# Patient Record
Sex: Male | Born: 1975 | Race: White | Hispanic: No | Marital: Single | Smoking: Current every day smoker
Health system: Southern US, Community
[De-identification: ages and names within clinical notes are randomized; demographics above are authoritative.]

## PROBLEM LIST (undated history)

## (undated) DIAGNOSIS — F4310 Post-traumatic stress disorder, unspecified: Secondary | ICD-10-CM

## (undated) DIAGNOSIS — G9050 Complex regional pain syndrome I, unspecified: Secondary | ICD-10-CM

## (undated) DIAGNOSIS — E079 Disorder of thyroid, unspecified: Secondary | ICD-10-CM

## (undated) DIAGNOSIS — F32A Depression, unspecified: Secondary | ICD-10-CM

## (undated) DIAGNOSIS — F419 Anxiety disorder, unspecified: Secondary | ICD-10-CM

## (undated) DIAGNOSIS — F329 Major depressive disorder, single episode, unspecified: Secondary | ICD-10-CM

## (undated) HISTORY — PX: SPINAL CORD STIMULATOR REMOVAL: SHX5379

## (undated) HISTORY — PX: APPENDECTOMY: SHX54

## (undated) HISTORY — PX: HAND SURGERY: SHX662

## (undated) HISTORY — PX: CHOLECYSTECTOMY: SHX55

## (undated) HISTORY — PX: SPINAL CORD STIMULATOR INSERTION: SHX5378

---

## 2005-06-25 ENCOUNTER — Emergency Department: Payer: Self-pay | Admitting: Emergency Medicine

## 2005-07-02 ENCOUNTER — Emergency Department: Payer: Self-pay | Admitting: Emergency Medicine

## 2005-07-03 ENCOUNTER — Emergency Department: Payer: Self-pay | Admitting: Emergency Medicine

## 2005-08-17 ENCOUNTER — Emergency Department: Payer: Self-pay | Admitting: Emergency Medicine

## 2005-09-13 ENCOUNTER — Other Ambulatory Visit: Payer: Self-pay

## 2005-09-13 ENCOUNTER — Inpatient Hospital Stay: Payer: Self-pay | Admitting: Internal Medicine

## 2005-09-14 ENCOUNTER — Inpatient Hospital Stay: Payer: Self-pay | Admitting: Unknown Physician Specialty

## 2006-06-22 ENCOUNTER — Emergency Department: Payer: Self-pay | Admitting: Emergency Medicine

## 2006-07-20 ENCOUNTER — Ambulatory Visit: Payer: Self-pay | Admitting: Pain Medicine

## 2006-08-03 ENCOUNTER — Encounter: Payer: Self-pay | Admitting: Specialist

## 2006-08-16 ENCOUNTER — Ambulatory Visit: Payer: Self-pay | Admitting: Pain Medicine

## 2006-08-29 ENCOUNTER — Ambulatory Visit: Payer: Self-pay | Admitting: Physician Assistant

## 2006-09-01 ENCOUNTER — Encounter: Payer: Self-pay | Admitting: Specialist

## 2006-09-19 ENCOUNTER — Ambulatory Visit: Payer: Self-pay | Admitting: Physician Assistant

## 2006-11-10 ENCOUNTER — Ambulatory Visit: Payer: Self-pay | Admitting: Physician Assistant

## 2006-11-15 ENCOUNTER — Ambulatory Visit: Payer: Self-pay | Admitting: Pain Medicine

## 2006-11-21 ENCOUNTER — Ambulatory Visit: Payer: Self-pay | Admitting: Pain Medicine

## 2006-12-06 ENCOUNTER — Ambulatory Visit: Payer: Self-pay | Admitting: Pain Medicine

## 2006-12-15 ENCOUNTER — Ambulatory Visit: Payer: Self-pay | Admitting: Physician Assistant

## 2007-01-12 ENCOUNTER — Ambulatory Visit: Payer: Self-pay | Admitting: Physician Assistant

## 2007-11-25 ENCOUNTER — Emergency Department: Payer: Self-pay | Admitting: Emergency Medicine

## 2007-12-17 ENCOUNTER — Inpatient Hospital Stay: Payer: Self-pay | Admitting: Unknown Physician Specialty

## 2007-12-20 ENCOUNTER — Ambulatory Visit: Payer: Self-pay | Admitting: Pain Medicine

## 2008-01-25 ENCOUNTER — Inpatient Hospital Stay: Payer: Self-pay | Admitting: Internal Medicine

## 2008-01-25 ENCOUNTER — Other Ambulatory Visit: Payer: Self-pay

## 2008-01-26 ENCOUNTER — Inpatient Hospital Stay: Payer: Self-pay | Admitting: Psychiatry

## 2008-03-17 ENCOUNTER — Emergency Department: Payer: Self-pay | Admitting: Emergency Medicine

## 2008-03-17 ENCOUNTER — Other Ambulatory Visit: Payer: Self-pay

## 2008-10-15 ENCOUNTER — Ambulatory Visit: Payer: Self-pay | Admitting: Pain Medicine

## 2008-11-06 ENCOUNTER — Ambulatory Visit: Payer: Self-pay | Admitting: Physician Assistant

## 2008-11-06 ENCOUNTER — Ambulatory Visit: Payer: Self-pay | Admitting: Pain Medicine

## 2008-11-12 ENCOUNTER — Ambulatory Visit: Payer: Self-pay | Admitting: Pain Medicine

## 2008-11-26 ENCOUNTER — Ambulatory Visit: Payer: Self-pay | Admitting: Physician Assistant

## 2008-12-05 ENCOUNTER — Ambulatory Visit (HOSPITAL_COMMUNITY): Admission: RE | Admit: 2008-12-05 | Discharge: 2008-12-05 | Payer: Self-pay | Admitting: Anesthesiology

## 2011-07-03 DEATH — deceased

## 2011-09-06 ENCOUNTER — Ambulatory Visit: Payer: Self-pay | Admitting: Pain Medicine

## 2011-09-09 ENCOUNTER — Ambulatory Visit: Payer: Self-pay | Admitting: Pain Medicine

## 2011-09-20 ENCOUNTER — Ambulatory Visit: Payer: Self-pay | Admitting: Pain Medicine

## 2011-09-29 ENCOUNTER — Ambulatory Visit: Payer: Self-pay | Admitting: Pain Medicine

## 2012-04-04 ENCOUNTER — Emergency Department (HOSPITAL_COMMUNITY)
Admission: EM | Admit: 2012-04-04 | Discharge: 2012-04-04 | Disposition: A | Discharge status: Home / Self Care | Payer: Medicare Other | Attending: Emergency Medicine | Admitting: Emergency Medicine

## 2012-04-04 ENCOUNTER — Emergency Department (HOSPITAL_COMMUNITY): Payer: Medicare Other

## 2012-04-04 ENCOUNTER — Encounter (HOSPITAL_COMMUNITY): Payer: Self-pay

## 2012-04-04 DIAGNOSIS — Z79899 Other long term (current) drug therapy: Secondary | ICD-10-CM | POA: Insufficient documentation

## 2012-04-04 DIAGNOSIS — E079 Disorder of thyroid, unspecified: Secondary | ICD-10-CM | POA: Insufficient documentation

## 2012-04-04 DIAGNOSIS — F341 Dysthymic disorder: Secondary | ICD-10-CM | POA: Insufficient documentation

## 2012-04-04 DIAGNOSIS — Y92009 Unspecified place in unspecified non-institutional (private) residence as the place of occurrence of the external cause: Secondary | ICD-10-CM | POA: Insufficient documentation

## 2012-04-04 DIAGNOSIS — W208XXA Other cause of strike by thrown, projected or falling object, initial encounter: Secondary | ICD-10-CM | POA: Insufficient documentation

## 2012-04-04 DIAGNOSIS — M25579 Pain in unspecified ankle and joints of unspecified foot: Secondary | ICD-10-CM | POA: Insufficient documentation

## 2012-04-04 DIAGNOSIS — M79673 Pain in unspecified foot: Secondary | ICD-10-CM

## 2012-04-04 HISTORY — DX: Depression, unspecified: F32.A

## 2012-04-04 HISTORY — DX: Disorder of thyroid, unspecified: E07.9

## 2012-04-04 HISTORY — DX: Major depressive disorder, single episode, unspecified: F32.9

## 2012-04-04 HISTORY — DX: Complex regional pain syndrome I, unspecified: G90.50

## 2012-04-04 HISTORY — DX: Anxiety disorder, unspecified: F41.9

## 2012-04-04 HISTORY — DX: Post-traumatic stress disorder, unspecified: F43.10

## 2012-04-04 NOTE — ED Provider Notes (Signed)
History     CSN: [id number]  Arrival date & time 04/04/12  1424   First MD Initiated Contact with Patient 04/04/12 1505      Chief Complaint  Patient presents with  . Foot Pain    (Consider location/radiation/quality/duration/timing/severity/associated sxs/prior treatment) HPI Comments: Patient reports that last evening he dropped a glass casserole dish containing two frozen pieces of chicken on the top of the left foot.  He has been having pain since that time.  He reports that while walking this morning he think that he felt a "pop"  The pain and swelling then became worse.  He denies any fever or chills.  He is seen by Pain Management and is on a fentanyl patch and takes oxycodone regularly for RSD.  He feels that the Oxycodone has helped the pain somewhat.  Pain worse with ambulation.    Patient is a 36 y.o. male presenting with lower extremity pain. The history is provided by the patient.  Foot Pain Pertinent negatives include no chills, fever, nausea, numbness or vomiting.    Past Medical History  Diagnosis Date  . RSD (reflex sympathetic dystrophy)     LT forearm/hand  . Thyroid disease     hyperthyroid w/radiation treatment  . PTSD (post-traumatic stress disorder)   . Depression   . Anxiety     Past Surgical History  Procedure Date  . Appendectomy   . Cholecystectomy   . Hand surgery     x 2  . Spinal cord stimulator insertion   . Spinal cord stimulator removal     History reviewed. No pertinent family history.  History  Substance Use Topics  . Smoking status: Current Everyday Smoker -- 0.5 packs/day    Types: Cigarettes  . Smokeless tobacco: Not on file  . Alcohol Use: No      Review of Systems  Constitutional: Negative for fever and chills.  Gastrointestinal: Negative for nausea and vomiting.  Musculoskeletal:       Swelling of the dorsal left foot  Skin: Negative for wound.  Neurological: Negative for numbness.    Allergies  Penicillins cross  reactors  Home Medications   Current Outpatient Rx  Name Route Sig Dispense Refill  . CLONAZEPAM 1 MG PO TABS Oral Take 1 mg by mouth 3 (three) times daily.    Marland Kitchen CLONIDINE HCL 0.3 MG PO TABS Oral Take 0.3 mg by mouth See admin instructions. In the am take .15mg  At at night .45mg     . FENTANYL 75 MCG/HR TD PT72 Transdermal Place 1 patch onto the skin 2 days. Applied new patch 04/03/12    . LEVOTHYROXINE SODIUM 100 MCG PO TABS Oral Take 100 mcg by mouth daily.    . OXYCODONE HCL 5 MG PO TABS Oral Take 20 mg by mouth 4 (four) times daily.    Marland Kitchen ZOLPIDEM TARTRATE ER 12.5 MG PO TBCR Oral Take 12.5 mg by mouth at bedtime.      BP 102/74  Pulse 85  Temp(Src) 98.4 F (36.9 C) (Oral)  Resp 16  SpO2 100%  Physical Exam  Nursing note and vitals reviewed. Constitutional: He appears well-developed and well-nourished. No distress.  HENT:  Head: Normocephalic and atraumatic.  Mouth/Throat: Oropharynx is clear and moist.  Cardiovascular: Normal rate, regular rhythm and normal heart sounds.   Pulses:      Dorsalis pedis pulses are 2+ on the right side, and 2+ on the left side.  Pulmonary/Chest: Effort normal and breath sounds normal.  Musculoskeletal:  Left ankle: He exhibits normal range of motion, no swelling, no ecchymosis and no deformity. no tenderness. Achilles tendon normal.       Swelling over the dorsal left foot in the area of the 1st-5th metatarsals.  Neurological: He is alert. No sensory deficit.  Skin: Skin is warm and dry. He is not diaphoretic.  Psychiatric: He has a normal mood and affect.    ED Course  Procedures (including critical care time)  Labs Reviewed - No data to display Dg Foot Complete Left  04/04/2012  *RADIOLOGY REPORT*  Clinical Data: Trauma to the top of the foot, pain and swelling  LEFT FOOT - COMPLETE 3+ VIEW  Comparison: None.  Findings: Tarsal - metatarsal alignment is normal.  No fracture is seen.  Joint spaces appear normal.  IMPRESSION: Negative.   Original Report Authenticated By: Juline Patch, M.D.     1. Foot pain       MDM  Patient presents with pain of the dorsal left foot after dropping a glass casserole dish on it last evening.  Negative xray.  Neurovascularly intact.  Patient already has Oxycodone at home.        Pascal Lux [place], PA-C 04/05/12 1456

## 2012-04-04 NOTE — ED Notes (Signed)
Pt states 2 frozen chickens and large glass dish dropped onto his LT foot last night.  Today he took a step outside and heard a pop.  He went to see his pain management MD who sent him here for xrays.  Pt is ambulatory, walks mainly on his heel.

## 2012-04-06 NOTE — ED Provider Notes (Signed)
Medical screening examination/treatment/procedure(s) were performed by non-physician practitioner and as supervising physician I was immediately available for consultation/collaboration.    Kadie Balestrieri R Giorgia Wahler, MD 04/06/12 1103 

## 2012-08-09 ENCOUNTER — Ambulatory Visit: Payer: Self-pay | Admitting: Pain Medicine

## 2012-08-24 ENCOUNTER — Ambulatory Visit: Payer: Self-pay | Admitting: Pain Medicine

## 2012-09-04 ENCOUNTER — Ambulatory Visit: Payer: Self-pay | Admitting: Pain Medicine

## 2012-12-17 ENCOUNTER — Emergency Department: Payer: Self-pay | Admitting: Emergency Medicine

## 2012-12-17 LAB — URINALYSIS, COMPLETE
Bilirubin,UR: NEGATIVE
Ketone: NEGATIVE
Leukocyte Esterase: NEGATIVE
Nitrite: NEGATIVE
Ph: 6 (ref 4.5–8.0)
RBC,UR: 2 /HPF (ref 0–5)
Squamous Epithelial: NONE SEEN

## 2012-12-17 LAB — COMPREHENSIVE METABOLIC PANEL
Albumin: 4.4 g/dL (ref 3.4–5.0)
Anion Gap: 6 — ABNORMAL LOW (ref 7–16)
Bilirubin,Total: 0.9 mg/dL (ref 0.2–1.0)
Chloride: 104 mmol/L (ref 98–107)
Co2: 29 mmol/L (ref 21–32)
Creatinine: 1.33 mg/dL — ABNORMAL HIGH (ref 0.60–1.30)
SGOT(AST): 27 U/L (ref 15–37)
SGPT (ALT): 39 U/L (ref 12–78)
Total Protein: 7.8 g/dL (ref 6.4–8.2)

## 2012-12-17 LAB — CBC
HCT: 49.8 % (ref 40.0–52.0)
MCHC: 33.1 g/dL (ref 32.0–36.0)
MCV: 85 fL (ref 80–100)
RBC: 5.9 10*6/uL (ref 4.40–5.90)
RDW: 14.2 % (ref 11.5–14.5)

## 2013-03-23 ENCOUNTER — Ambulatory Visit: Payer: Self-pay | Admitting: Family Medicine

## 2013-05-31 IMAGING — CT CT ABD-PELV W/ CM
1 of 2 series · 15 of 32 positions shown, 19 images · IV contrast (isovue)
Comparison: none

REASON FOR EXAM: Lower abd pain
COMMENTS:

PROCEDURE:     KCT - KCT ABDOMEN/PELVIS W  - March 23, 2013  [DATE]
RESULT:     Comparison:  12/18/2012
TECHNIQUE: Multiple axial images of the abdomen and pelvis were performed
from the lung bases to the pubic symphysis, with p.o. contrast and with 85
mL of Isovue 300 intravenous contrast.

[Series 2: abd 3mm w 3.0 i40f 3 · axial · 0.72mm/px · z∈[-1030,-610]mm · 15 of 154 slices shown, 19 images]
[im 7/154  soft-tissue]
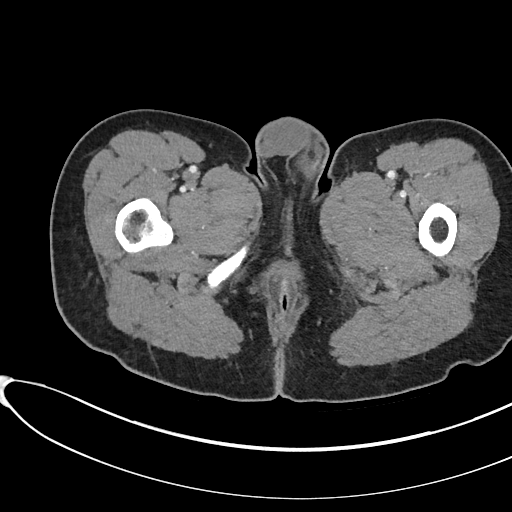
[im 7/154  bone]
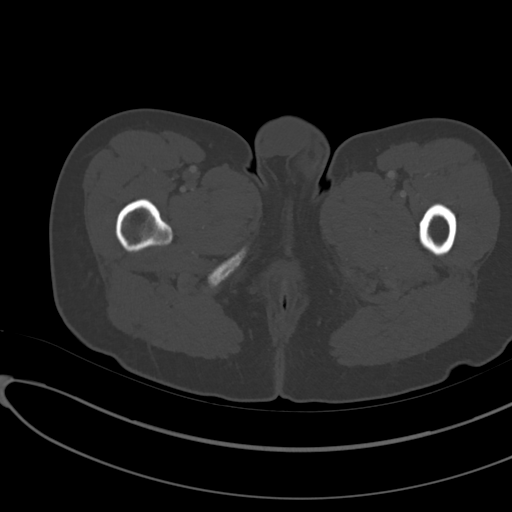
[im 19/154  soft-tissue]
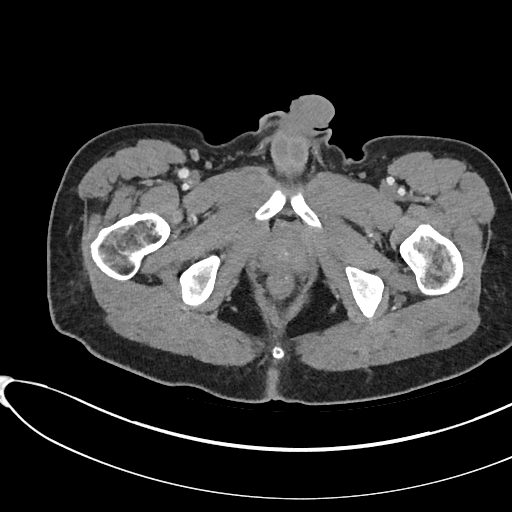
[im 31/154  soft-tissue]
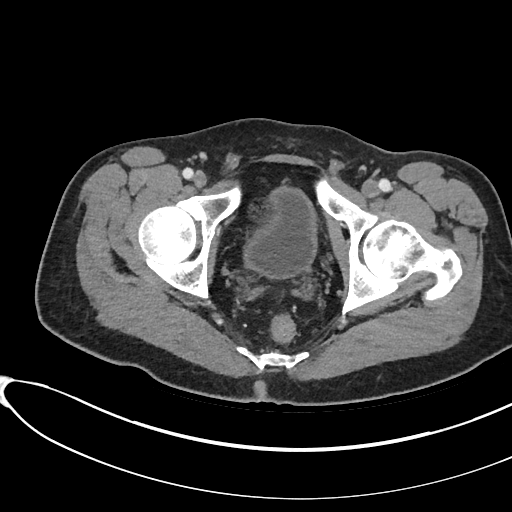
[im 43/154  soft-tissue]
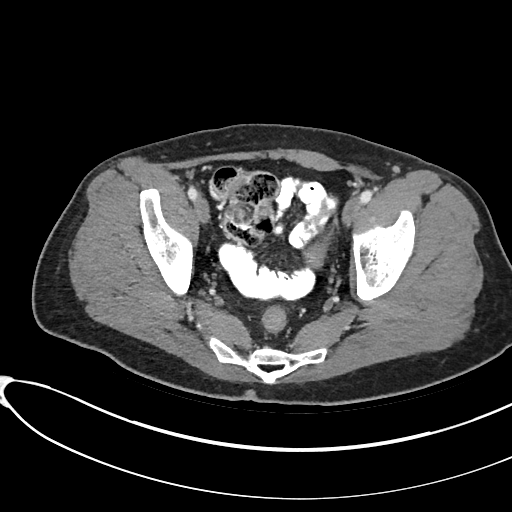
[im 56/154  soft-tissue]
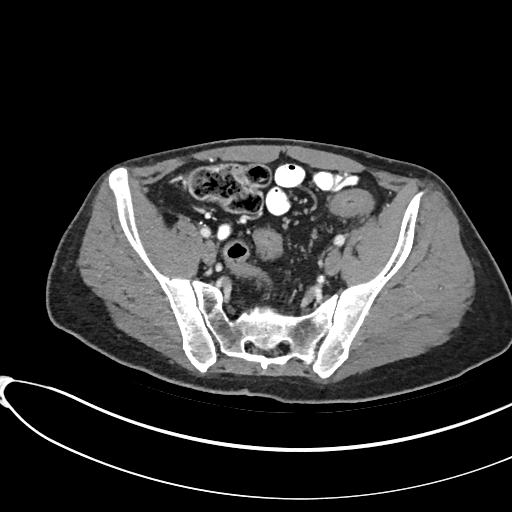
[im 68/154  soft-tissue]
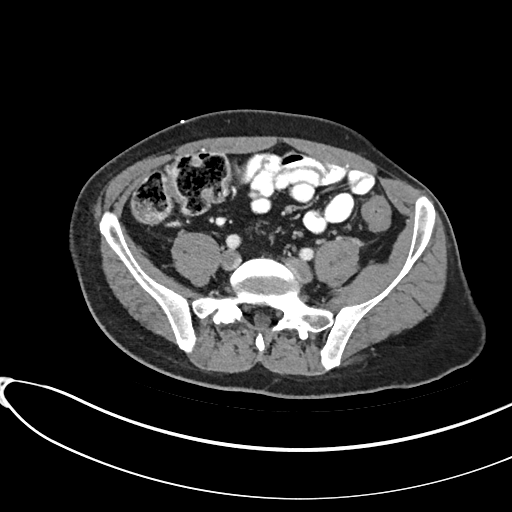
[im 80/154  soft-tissue]
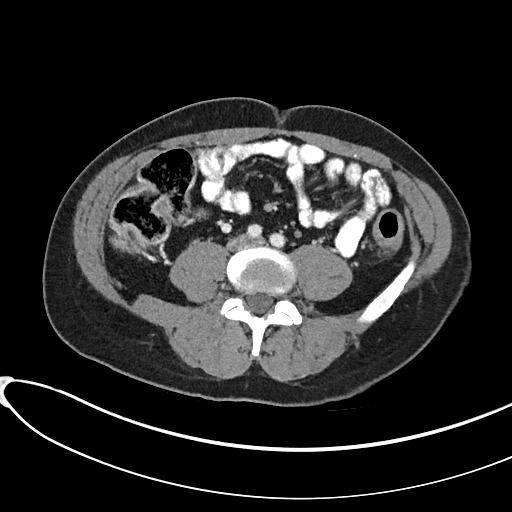
[im 86/154  soft-tissue]
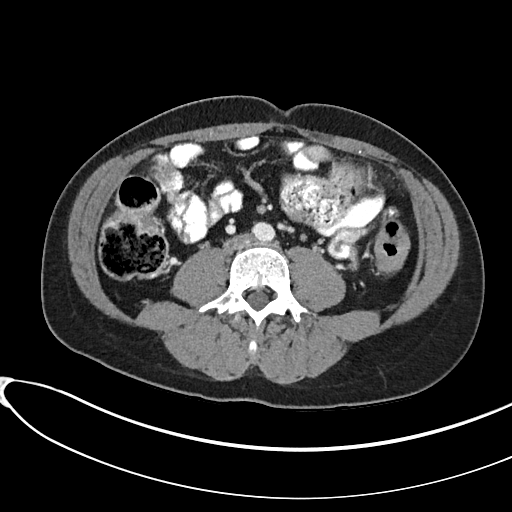
[im 98/154  soft-tissue]
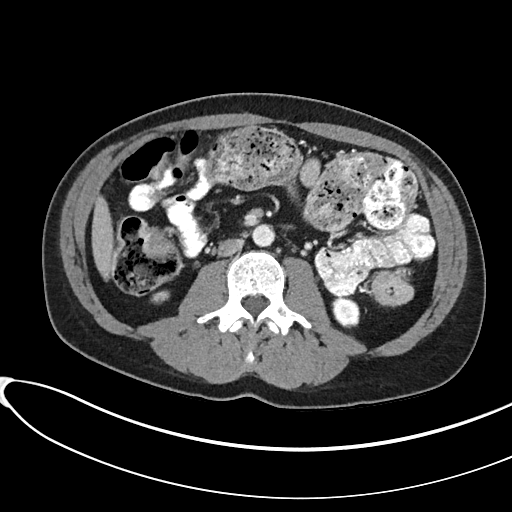
[im 98/154  bone]
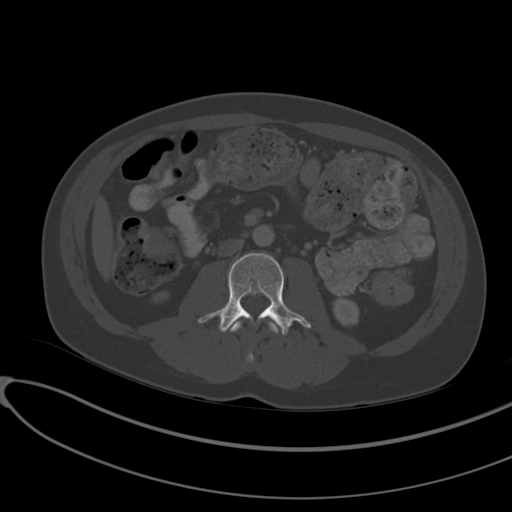
[im 111/154  soft-tissue]
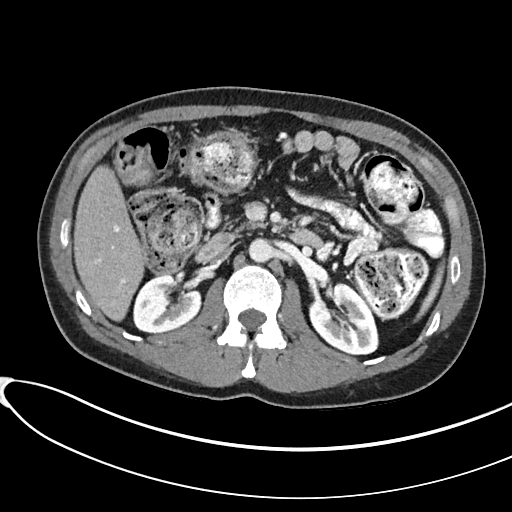
[im 123/154  soft-tissue]
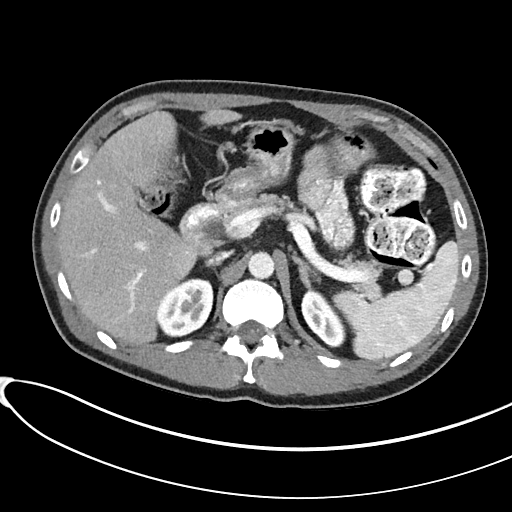
[im 129/154  lung]
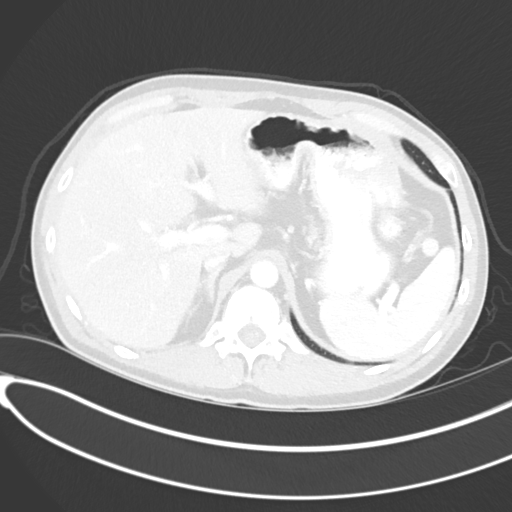
[im 135/154  soft-tissue]
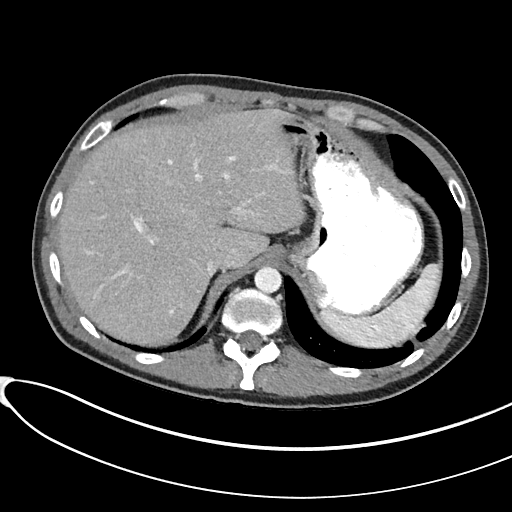
[im 135/154  lung]
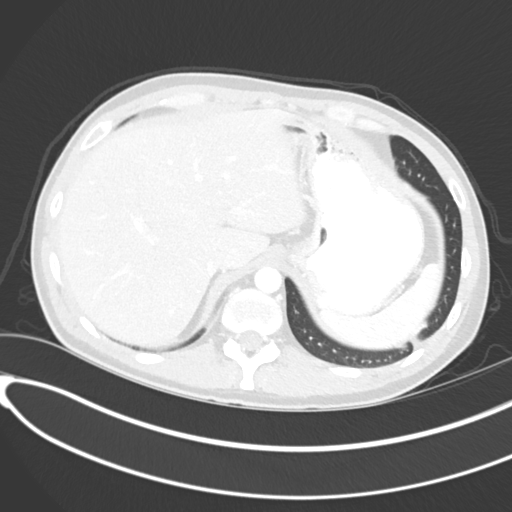
[im 141/154  lung]
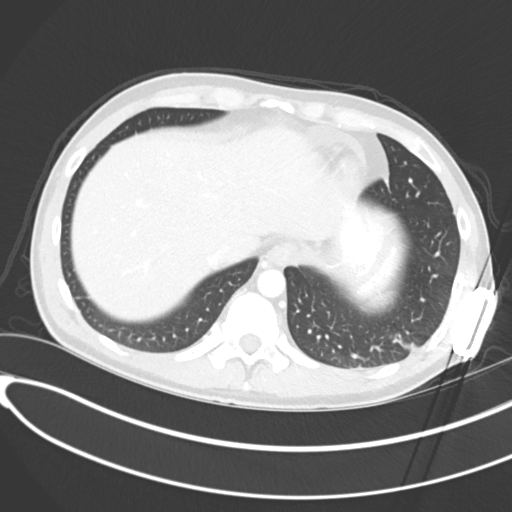
[im 147/154  soft-tissue]
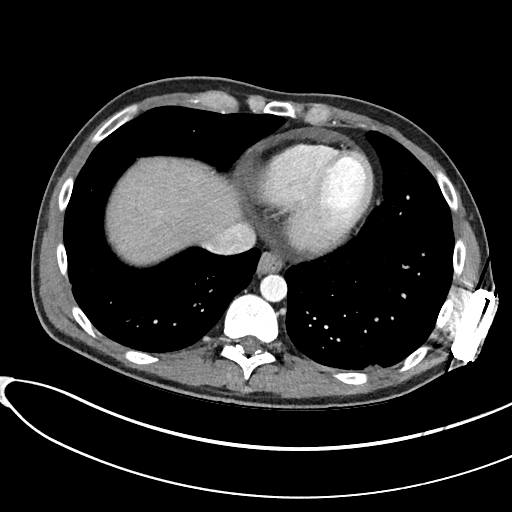
[im 147/154  lung]
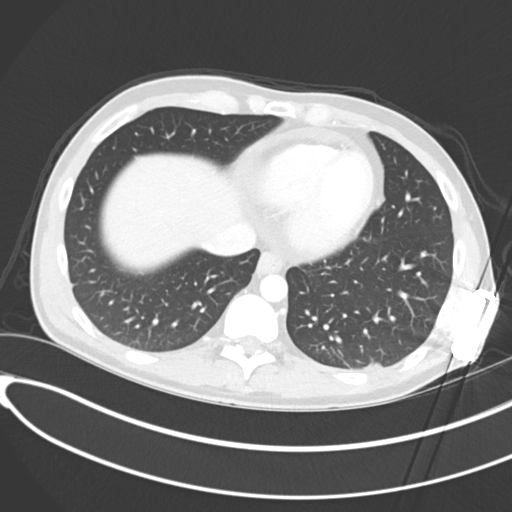

[15 of 32 positions shown; findings below may reference images not displayed]

FINDINGS: Mild subpleural opacities are likely secondary to atelectasis. A metallic
generator device overlies the left lateral lower thorax. There is a trace
amount of pericardial fluid.

Low-attenuation along the falciform ligament likely represents focal fatty
deposition. Surgical clips are seen from prior cholecystectomy. The spleen,
adrenals, and pancreas are unremarkable. The kidneys enhance normally.

The small bowel is normal in caliber. The appendix is not visualized.
However, there are no inflammatory changes at the base of the cecum. The
ascending colon and transverse colon are mildly distended with stool. This
is seen to the level of the splenic flexure were the colon distal to this
level is decompressed. There is apparent bowel wall thickening of the
descending colon which is more conspicuous and the prior CT. This bowel
thickening is seen in the descending colon as well as to a lesser extent the
sigmoid colon. There does not appear to be significant inflammatory change
in the adjacent fat.

No aggressive lytic or sclerotic osseous lesions are identified.
IMPRESSION: The ascending and transverse colon are mildly distended with stool, with
relative decompression of the descending and sigmoid colon. There appears to
be bowel wall thickening of the descending and sigmoid colon which is more
conspicuous than prior. An infiltrative or inflammatory process involving
the descending and sigmoid colon is raised. Colonoscopy is suggested for
further evaluation. Given the distention with stool in the transverse colon,
correlate for some degree of bowel obstruction.

## 2014-03-14 LAB — BASIC METABOLIC PANEL
Anion Gap: 3 — ABNORMAL LOW (ref 7–16)
BUN: 21 mg/dL — ABNORMAL HIGH (ref 7–18)
CALCIUM: 8.9 mg/dL (ref 8.5–10.1)
CHLORIDE: 105 mmol/L (ref 98–107)
CREATININE: 1.15 mg/dL (ref 0.60–1.30)
Co2: 33 mmol/L — ABNORMAL HIGH (ref 21–32)
EGFR (African American): >60
GLUCOSE: 90 mg/dL (ref 65–99)
Osmolality: 284 (ref 275–301)
Potassium: 3.9 mmol/L (ref 3.5–5.1)
SODIUM: 141 mmol/L (ref 136–145)

## 2014-03-14 LAB — CBC
HCT: 47.4 % (ref 40.0–52.0)
HGB: 16.1 g/dL (ref 13.0–18.0)
MCH: 28.6 pg (ref 26.0–34.0)
MCHC: 34 g/dL (ref 32.0–36.0)
MCV: 84 fL (ref 80–100)
Platelet: 227 10*3/uL (ref 150–440)
RBC: 5.63 10*6/uL (ref 4.40–5.90)
RDW: 13.7 % (ref 11.5–14.5)
WBC: 6.8 10*3/uL (ref 3.8–10.6)

## 2014-03-14 LAB — PROTIME-INR
INR: 1.1
Prothrombin Time: 13.9 secs (ref 11.5–14.7)

## 2014-03-14 LAB — APTT: ACTIVATED PTT: 34.9 s (ref 23.6–35.9)

## 2014-03-14 LAB — SEDIMENTATION RATE: Erythrocyte Sed Rate: 1 mm/hr (ref 0–15)

## 2014-03-15 ENCOUNTER — Inpatient Hospital Stay: Payer: Self-pay | Admitting: Internal Medicine

## 2014-03-15 LAB — CBC WITH DIFFERENTIAL/PLATELET
BASOS ABS: 0 10*3/uL (ref 0.0–0.1)
BASOS PCT: 0.7 %
Eosinophil #: 0.2 10*3/uL (ref 0.0–0.7)
Eosinophil %: 3.5 %
HCT: 44.2 % (ref 40.0–52.0)
HGB: 15.2 g/dL (ref 13.0–18.0)
Lymphocyte #: 1.5 10*3/uL (ref 1.0–3.6)
Lymphocyte %: 26.8 %
MCH: 28.8 pg (ref 26.0–34.0)
MCHC: 34.4 g/dL (ref 32.0–36.0)
MCV: 84 fL (ref 80–100)
MONOS PCT: 7.5 %
Monocyte #: 0.4 x10 3/mm (ref 0.2–1.0)
NEUTROS ABS: 3.4 10*3/uL (ref 1.4–6.5)
Neutrophil %: 61.5 %
PLATELETS: 205 10*3/uL (ref 150–440)
RBC: 5.28 10*6/uL (ref 4.40–5.90)
RDW: 13.4 % (ref 11.5–14.5)
WBC: 5.5 10*3/uL (ref 3.8–10.6)

## 2014-03-16 LAB — CBC WITH DIFFERENTIAL/PLATELET
Basophil #: 0 10*3/uL (ref 0.0–0.1)
Basophil %: 1 %
EOS ABS: 0.2 10*3/uL (ref 0.0–0.7)
Eosinophil %: 3.4 %
HCT: 42.2 % (ref 40.0–52.0)
HGB: 14.4 g/dL (ref 13.0–18.0)
LYMPHS PCT: 33.6 %
Lymphocyte #: 1.7 10*3/uL (ref 1.0–3.6)
MCH: 28.6 pg (ref 26.0–34.0)
MCHC: 34.1 g/dL (ref 32.0–36.0)
MCV: 84 fL (ref 80–100)
MONO ABS: 0.3 x10 3/mm (ref 0.2–1.0)
MONOS PCT: 6.3 %
NEUTROS ABS: 2.8 10*3/uL (ref 1.4–6.5)
Neutrophil %: 55.7 %
PLATELETS: 201 10*3/uL (ref 150–440)
RBC: 5.04 10*6/uL (ref 4.40–5.90)
RDW: 13.6 % (ref 11.5–14.5)
WBC: 5 10*3/uL (ref 3.8–10.6)

## 2014-03-16 LAB — BASIC METABOLIC PANEL
Anion Gap: 3 — ABNORMAL LOW (ref 7–16)
Anion Gap: 4 — ABNORMAL LOW (ref 7–16)
BUN: 19 mg/dL — AB (ref 7–18)
BUN: 18 mg/dL (ref 7–18)
CALCIUM: 8.5 mg/dL (ref 8.5–10.1)
CO2: 30 mmol/L (ref 21–32)
CREATININE: 1.04 mg/dL (ref 0.60–1.30)
Calcium, Total: 8.4 mg/dL — ABNORMAL LOW (ref 8.5–10.1)
Chloride: 104 mmol/L (ref 98–107)
Chloride: 108 mmol/L — ABNORMAL HIGH (ref 98–107)
Co2: 32 mmol/L (ref 21–32)
Creatinine: 1.27 mg/dL (ref 0.60–1.30)
Glucose: 161 mg/dL — ABNORMAL HIGH (ref 65–99)
Glucose: 123 mg/dL — ABNORMAL HIGH (ref 65–99)
OSMOLALITY: 286 (ref 275–301)
Osmolality: 283 (ref 275–301)
POTASSIUM: 3.6 mmol/L (ref 3.5–5.1)
POTASSIUM: 4.1 mmol/L (ref 3.5–5.1)
SODIUM: 139 mmol/L (ref 136–145)
SODIUM: 142 mmol/L (ref 136–145)

## 2014-03-16 LAB — VANCOMYCIN, TROUGH: Vancomycin, Trough: 17 ug/mL (ref 10–20)

## 2014-03-17 LAB — BASIC METABOLIC PANEL WITH GFR
Anion Gap: 1 — ABNORMAL LOW
BUN: 18 mg/dL
Calcium, Total: 8.4 mg/dL — ABNORMAL LOW
Chloride: 109 mmol/L — ABNORMAL HIGH
Co2: 30 mmol/L
Creatinine: 1.33 mg/dL — ABNORMAL HIGH
EGFR (African American): >60
EGFR (Non-African Amer.): >60
Glucose: 150 mg/dL — ABNORMAL HIGH
Osmolality: 284
Potassium: 3.8 mmol/L
Sodium: 140 mmol/L

## 2014-03-26 ENCOUNTER — Emergency Department: Payer: Self-pay | Admitting: Emergency Medicine

## 2014-03-26 LAB — CBC WITH DIFFERENTIAL/PLATELET
BASOS PCT: 0.3 %
Basophil #: 0 10*3/uL (ref 0.0–0.1)
EOS PCT: 0.7 %
Eosinophil #: 0.1 10*3/uL (ref 0.0–0.7)
HCT: 48.8 % (ref 40.0–52.0)
HGB: 16.3 g/dL (ref 13.0–18.0)
LYMPHS PCT: 11 %
Lymphocyte #: 0.9 10*3/uL — ABNORMAL LOW (ref 1.0–3.6)
MCH: 28.3 pg (ref 26.0–34.0)
MCHC: 33.5 g/dL (ref 32.0–36.0)
MCV: 84 fL (ref 80–100)
MONO ABS: 0.6 x10 3/mm (ref 0.2–1.0)
Monocyte %: 6.8 %
NEUTROS ABS: 6.8 10*3/uL — AB (ref 1.4–6.5)
NEUTROS PCT: 81.2 %
PLATELETS: 281 10*3/uL (ref 150–440)
RBC: 5.78 10*6/uL (ref 4.40–5.90)
RDW: 14.3 % (ref 11.5–14.5)
WBC: 8.4 10*3/uL (ref 3.8–10.6)

## 2014-03-26 LAB — COMPREHENSIVE METABOLIC PANEL
ALBUMIN: 4 g/dL (ref 3.4–5.0)
ALK PHOS: 78 U/L
ALT: 50 U/L (ref 12–78)
AST: 40 U/L — AB (ref 15–37)
Anion Gap: 7 (ref 7–16)
BILIRUBIN TOTAL: 0.7 mg/dL (ref 0.2–1.0)
BUN: 19 mg/dL — AB (ref 7–18)
CALCIUM: 9.1 mg/dL (ref 8.5–10.1)
CHLORIDE: 105 mmol/L (ref 98–107)
Co2: 28 mmol/L (ref 21–32)
Creatinine: 0.95 mg/dL (ref 0.60–1.30)
EGFR (African American): >60
EGFR (Non-African Amer.): >60
Glucose: 111 mg/dL — ABNORMAL HIGH (ref 65–99)
OSMOLALITY: 282 (ref 275–301)
POTASSIUM: 4 mmol/L (ref 3.5–5.1)
SODIUM: 140 mmol/L (ref 136–145)
Total Protein: 7.1 g/dL (ref 6.4–8.2)

## 2014-03-26 LAB — SEDIMENTATION RATE: Erythrocyte Sed Rate: 1 mm/hr (ref 0–15)

## 2014-03-28 ENCOUNTER — Encounter: Payer: Self-pay | Admitting: Internal Medicine

## 2014-04-01 ENCOUNTER — Encounter: Payer: Self-pay | Admitting: Internal Medicine

## 2014-05-01 ENCOUNTER — Encounter: Payer: Self-pay | Admitting: Internal Medicine

## 2014-05-22 IMAGING — CT CT ANGIO EXTREM UP*R*
3 of 7 series · 15 of 46 positions shown, 18 images · IV contrast (isovue)
Comparison: None.

CLINICAL DATA: Spider bite to right hand. Right hand is cool with
diminished capillary refill.

EXAM:
CT ANGIOGRAPHY OF THE RIGHT UPPER EXTREMITY
TECHNIQUE: Multidetector CT imaging of the right upperwas performed using the
standard protocol during bolus administration of intravenous
contrast. Multiplanar CT image reconstructions and MIPs were
obtained to evaluate the vascular anatomy.
CONTRAST:  100 cc Isovue 370

[Series 5: arterial abd pel · axial · arterial · 0.62mm/px · z∈[-722,-48]mm · 12 of 251 slices shown, 15 images]
[im 17/251  soft-tissue]
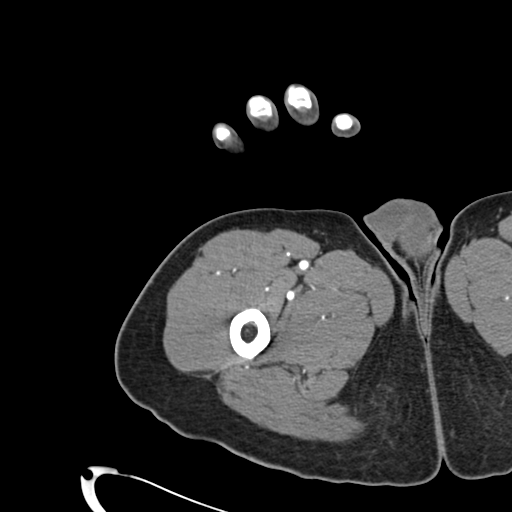
[im 17/251  bone]
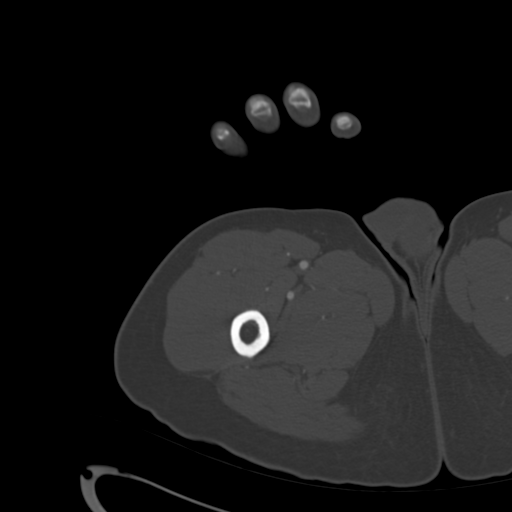
[im 49/251  soft-tissue]
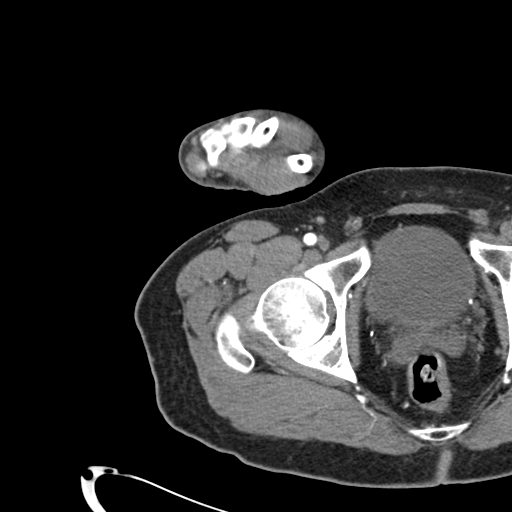
[im 73/251  soft-tissue]
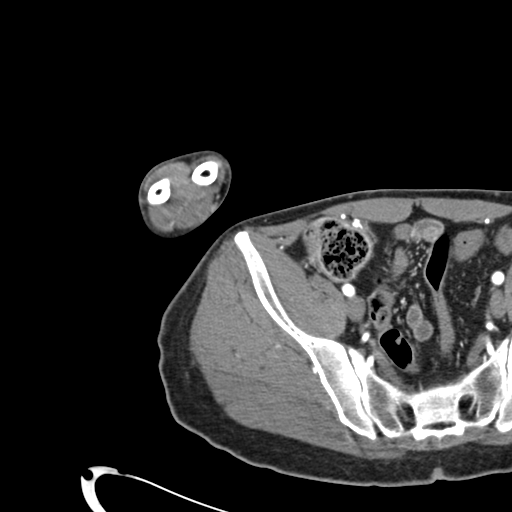
[im 97/251  soft-tissue]
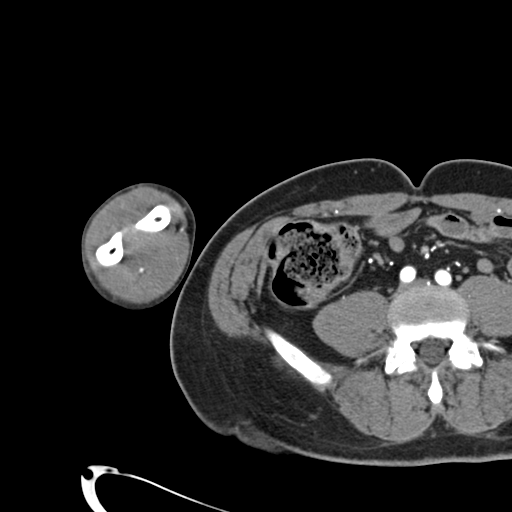
[im 130/251  soft-tissue]
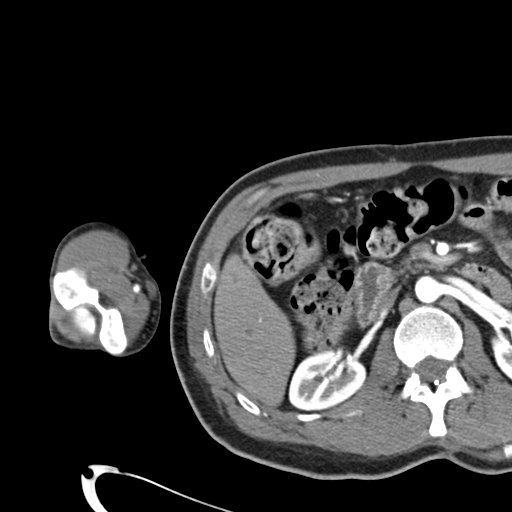
[im 154/251  soft-tissue]
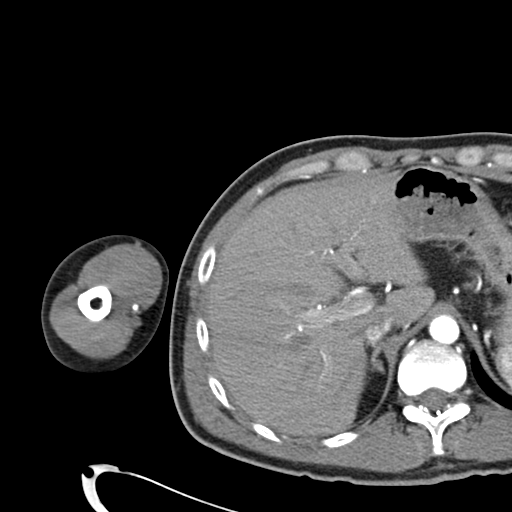
[im 178/251  soft-tissue]
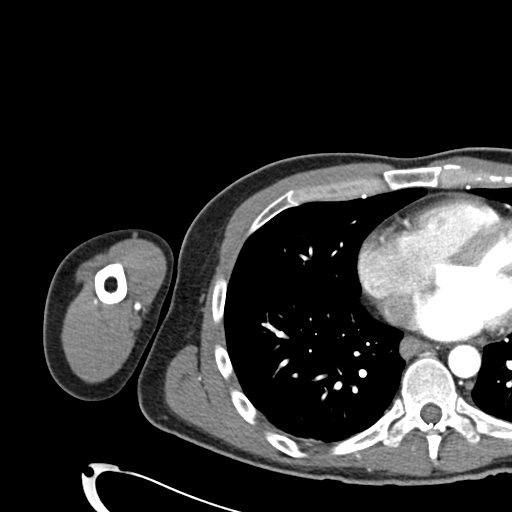
[im 210/251  soft-tissue]
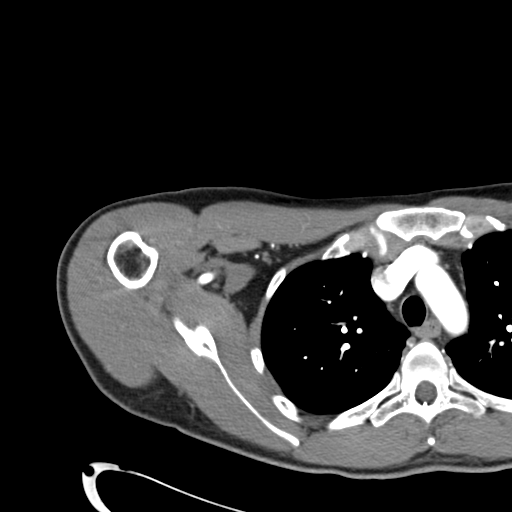
[im 218/251  lung]
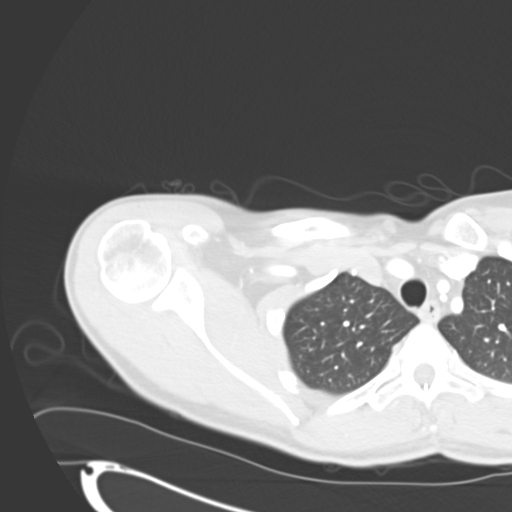
[im 226/251  lung]
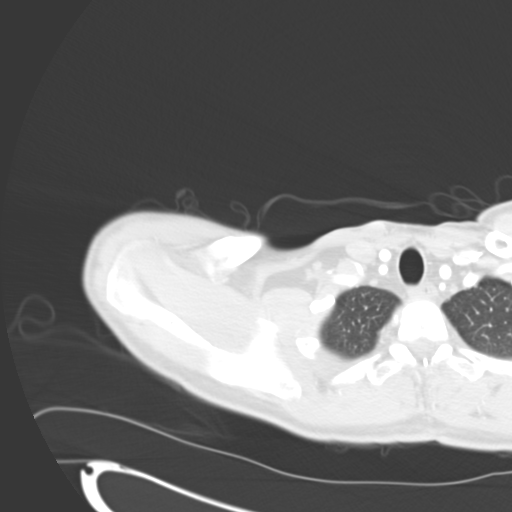
[im 234/251  soft-tissue]
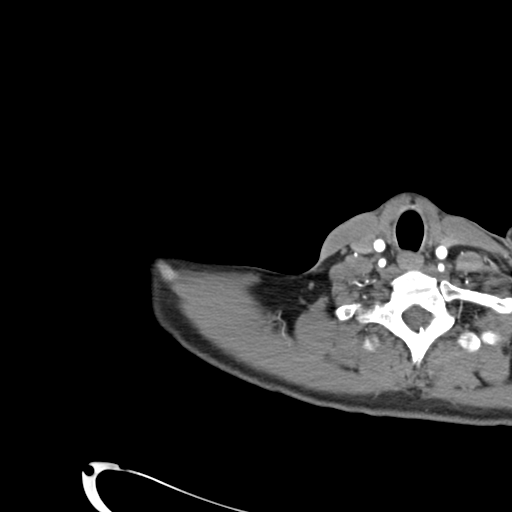
[im 234/251  lung]
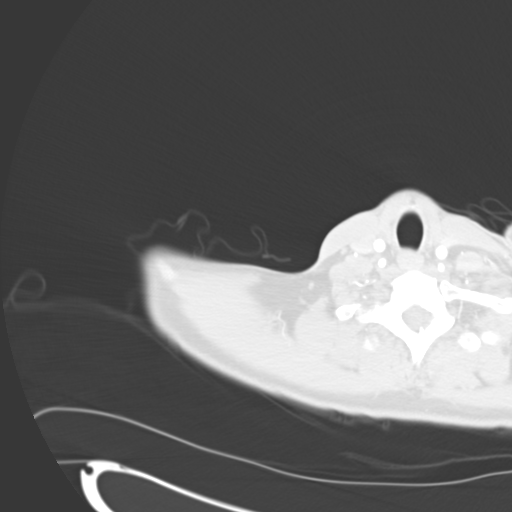
[im 234/251  bone]
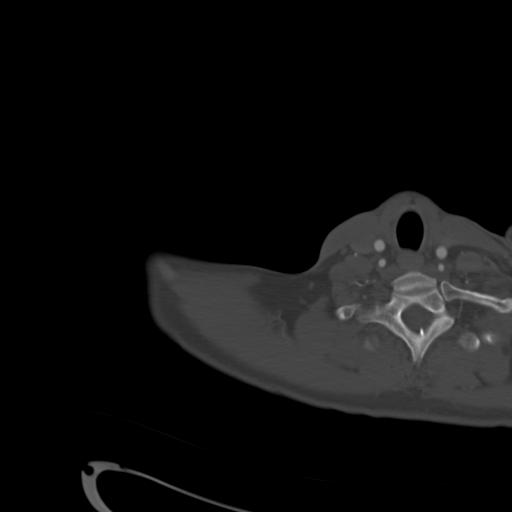
[im 242/251  lung]
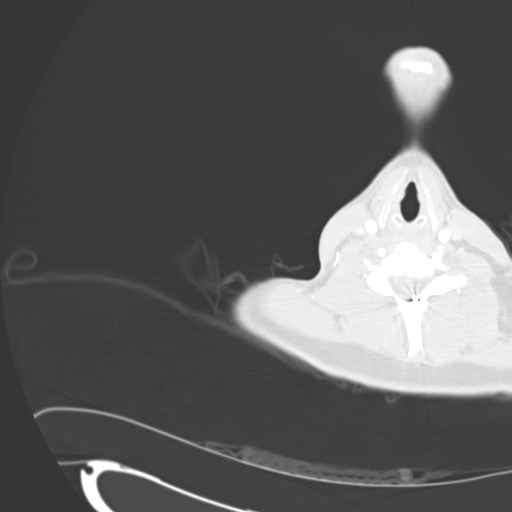

[Series 13: upper sag mpr 2 · sagittal · 1.46mm/px · 1 of 112 slices shown]
[im 41/112  soft-tissue]
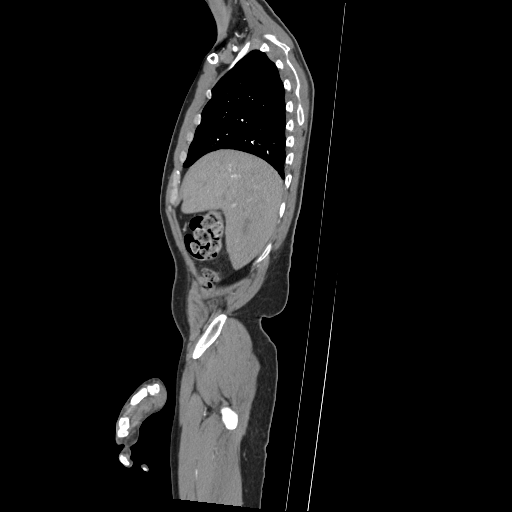

[Series 14: upper cor mpr 2 · coronal · 1.05mm/px · 2 of 144 slices shown]
[im 48/144  soft-tissue]
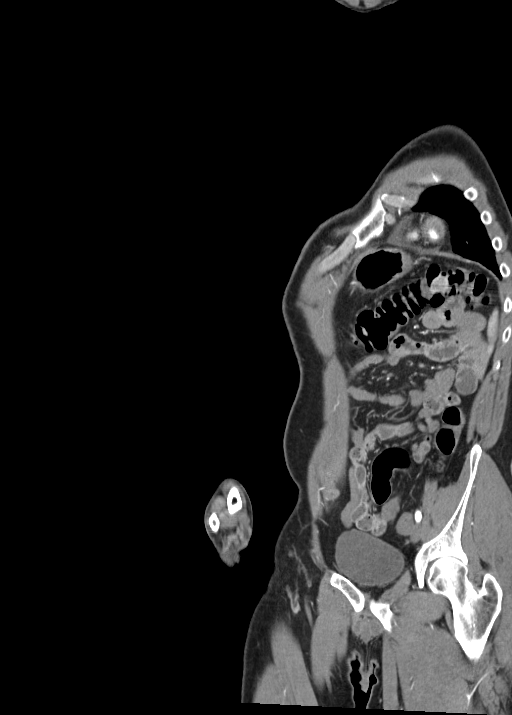
[im 96/144  soft-tissue]
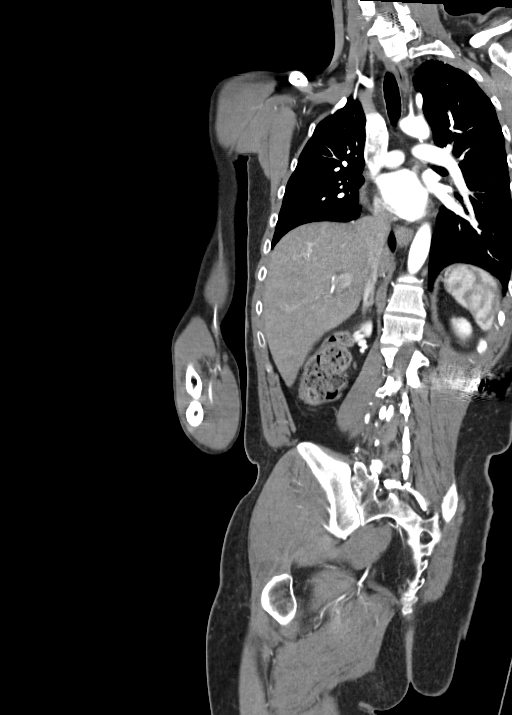

[15 of 46 positions shown; findings below may reference images not displayed]

FINDINGS: Ascending aorta and arch are patent and non aneurysmal. No evidence
of dissection. Visualized great vessels are patent.

Right subclavian and common carotid arteries are patent. Bilateral
vertebral arteries within the confines of the exam are patent.
Direct takeoff of the left vertebral artery from the arch.

Right axillary and brachial arteries are widely patent. Within the
forearm, right radial and ulnar arteries are grossly patent.
Interosseous artery is grossly patent. Beyond the distal forearm,
the arterial structures are non-opacified.

A spinal cord stimulator enters the epidural space at the L2 level.
It remains in the posterior central canal epidural space throughout
the thoracic region. The tip of the stimulator is at mid and lower
cervical spine within the posterior epidural space.

Examination of the hand demonstrates that the musculature of the
thenar are space and first web space are prominent. There is
blurring of the fat planes. Developing edema or compartment syndrome
within the hand is not excluded.

Post cholecystectomy.  Bladder is distended.

Review of the MIP images confirms the above findings.
IMPRESSION: No obvious obstruction involving the arterial supply to the right
upper extremity as described above. There is runoff to the distal
forearm which may simply be related to timing of bolus.

There is blurring of the fat planes within the musculature of the
right thenar are eminence and first web space. Developing edema
within the musculature is not excluded. Correlate with clinical
examination of the hand.

Spinal cord stimulator in the cervical spine as described.

## 2015-02-18 NOTE — Op Note (Signed)
PATIENT NAME:  William Hawkins, William Hawkins MR#:  [id number] DATE OF BIRTH:  1976-07-30  DATE OF PROCEDURE:  08/24/2012  Location: Operating Room Referring Physician: Nicholaus Bloom, M.D. Procedure by: Kathlen Brunswick. Dossie Arbour, M.D.  Note: This is the case of a 39 year old white male patient who comes in to day surgery today for placement of a left permanent cervical spinal cord stimulator. He had a prior unit which was working well but had to be removed in order for him to have an MRI. We have already done this and he wants to have the unit put back for the treatment of his upper extremity complex regional pain syndrome. This time, we will put in a MRI compatible Medtronic unit.   Procedure(s):  1. Permanent implantation of Cervical Epidural, Single Percutaneous Neurostimulator Lead (8 electrode array). 2. Implantation of Epidural Neurostimulator Generator. 3. Fluoroscopic Needle Guidance 4. Intraoperative Analysis and Programming. 5. Postoperative Analysis and Programming. 6. Moderate Conscious Sedation by the [facility] Anesthesia Team.  Surgeon: Kathlen Brunswick. Dossie Arbour, M.D. Side of implant: Left side. Top electrode tip level: C5. Diagnostic Indications: Left upper extremity complex regional pain syndrome.  Position: Prone.  Prepping solution: DuraPrep Area prepped: The Cervical, thoracic, and lumbosacral areas, were prepped with a broad-spectrum topical antiseptic microbicide. Target area: Cervical Epidural Space, around the C5 vertebral body, for the electrode tip. Insertion site is the T1-3 intervertebral space. Level entered: T2-3 Number of attempts: one  Infection Control: Standard Universal Precautions taken (Respiratory Hygiene/Cough Etiquette; Mouth, nose, eye protection; Hand Hygiene; Personal protective equipment (PPE); safe injection practices; and use of masks and disposable sterile surgical gloves) as recommended by the Department of [place] for Disease Control and Prevention  (CDC).  Safety Measures: Allergies were reviewed. Appropriate site, procedure, and patient were confirmed by following the Joint Commission's Universal Protocol (UP.01.01.01). The patient was asked to confirm marked site and procedure, before commencing. The patient was asked about blood thinners, or active infections, both of which were denied. No attempt was made at seeking any paresthesias. Aspiration looking for blood return was conducted prior to injecting. At no point did we inject any substances, as a needle was being advanced.  Pre-procedure Assessment:  A medical history and physical exam were obtained. Relevant documentation was reviewed and verified. Prior to the procedure, the patient was provided with an Audio CD, as well as written information on the procedure, including side-effects, and possible complications. Under the influence of no sedatives, a verbal, as well as a written informed consent were obtained, after having provided information on the risks and possible complications. To fulfill our ethical and legal obligations, as recommended by the American Medical Association's Code of Ethics, we have provided information to the patient about our clinical impression; the nature and purpose of an available treatment or procedure; the risks and benefits of an available treatment or procedure; alternatives; the risk and benefits of the alternative treatment or procedure; and the risks and benefits of not receiving or undergoing a treatment or procedure. The patient was provided information about the risks and possible complications associated with the procedure. These include, but not limited to, failure to achieve desired goals, infection, bleeding, organ or nerve damage, allergic reactions, paralysis, and death. In addition, the patient was informed that Medicine is not an exact science; therefore, there is also the possibility of unforeseen risks and possible complications that may result in a  catastrophic outcome. The patient indicated having understood very clearly.  We have given the patient no guarantees  and we have made no promises. Ample time was given to the patient to ask questions, all of which were answered, to the patient's satisfaction, before proceeding. The patient understands that by signing our informed consent form, they understand and accept the risks and the fact that it is impossible to predict all possible complications. Baseline vital signs were taken and the medical assessment was completed. Verification of the correct person, correct site (including marking of site), and correct procedure were performed and confirmed by the patient. Baseline vital signs were taken and the initial assessment was completed. Verification of the correct person, correct site (including marking of site), and correct procedure were performed and confirmed by the patient, in the form of a "Time Out".  Monitoring: The patient was monitored in the usual manner, using NIBPM, ECG, and pulse oximetry.  IV Access:  An IV access was obtained and secured.  Analgesia:  Moderate (Conscious) Intravenous sedation: Consent was obtained before administering any sedation. Availability of a responsible, adult driver, and NPO status confirmed. Meaningful verbal contact was maintained, with the patient at all times during the procedure. ASA Sedation Guidelines followed.  Prophylactic Antibiotics: Vancomycin one gram IV slowly over 60 minutes.  Local Anesthesia: Lidocaine 1%. The skin over the procedure site were infiltrated using a 3 ml Luer-Lok syringe with a 0.5 inch, 25-G needle. Deeper tissues were infiltrated using a 3.0 inch, 22-G spinal needle, under fluoroscopic guidance.  Fluoroscopy: The patient was taken to the operative suite, where the patient was placed in position for the procedure, over the fluoroscopy compatible table. Fluoroscopy was manipulated, using "Tunnel Vision Technique", to obtain the  best possible view of the target area, on the affected side. Parallax error was corrected before commencing the procedure. Gabor Racz's "Direction-Depth-Direction" technique was used to introduce the procedural needle under continuous pulsed fluoroscopic guidance. Once the target was reached, antero-posterior and lateral fluoroscopic views were taken to confirm needle placement in two planes. Fluoroscopy time: Please see the patient's chart for details.  Description of the procedure: The procedure site was prepped using a broad-spectrum topical antiseptic. The area was then draped in the usual and standard manner. "Time-out" was performed as per JC Universal Protocol (UP.01.01.01).   A midline incision was made over the spinous processes, and hemostasis attained. The procedural needle was then introduced through the incision using Gabor Racz's "Direction-Depth-Direction" technique, under pulsed fluoroscopic guidance. No attempt was made at seeking a paresthesia. The paramidline approach was used to enter the posterior Cervical epidural space at a 30 degree angle, using "Loss-of-resistance Technique" with 3 ml of PF-NaCl (0.9% NSS) + 0.5 ml of air, in a 5 ml glass syringe, using a "loss-of-bounce technique", at the desired level. Correct needle placement was confirmed in the  antero-posterior and lateral fluoroscopic views. The epidural lead was gently introduced under real-time fluoroscopy, constantly assessing for pain or paresthesias, until the tip was observed to be at the target level, on the side ipsilateral to the pain. Once the target was thought to have been reached, antero-posterior and lateral fluoroscopic views were taken to confirm electrode placement in two planes. Placement was tested until a comfortable stimulation pattern was observed over the usual painful area. Once the patient had assured [place] that the stimulation was in the correct pattern, and distribution, we proceeded to remove the 15-G  "Tuohy" epidural needle. This was done while observing the electrode tip under real-time fluoroscopy to prevent movement. The patient was sedated again, and 1% lidocaine was infiltrated into  the buttocks area, in order to create the generator pocket. The extension was tunneled and connected to the generator. An impedance check was conducted after connecting all sections of the system. Once hemostasis was confirmed, both wounds were closed with Vicryl 2-0 after cleaning them with a solution containing 50:50 hydrogen peroxide and Betadine. Surgical staples were used to close the skin. The wounds were covered with sterile transparent bio-occlusive dressings, to easily assess any evidence of infection in the future.  The patient tolerated the entire procedure well. A repeat set of vitals were taken after the procedure and the patient was kept under observation until discharge criteria was met. The patient was provided with discharge instructions, including a section on how to identify potential problems. Should any problems arise concerning this procedure, the patient was given instructions to immediately contact us, without hesitation. The neurostimulator representative and I, both provided the patient with our Business cards containing our contact telephone numbers, and instructed the patient to contact either one of [place], at any time, should there be any problems or questions. In any case, we plan to contact the patient by telephone for a follow-up status report regarding this interventional procedure.  EBL: 20 ml  Complications: No heme; no paresthesias.  Disposition: Return to clinics in 10-11 days for removal of staples and postoperative evaluation.  Additional Comments/Plan: None.  Equipment used:  1. Medtronic generator model [id number] serial number [id number].  2. Medtronic accessory kit model [id number] lot number [id number].  3. Medtronic lead kid model [id number], lot number [id number].     Disclaimer: Medicine is not an Chief Strategy Officer. The only guarantee in medicine is that nothing is guaranteed. It is important to note that the decision to proceed with this intervention was based on the information collected from the patient. The Data and conclusions were drawn from the patient's questionnaire, the interview, and the physical examination. Because the information was provided in large part by the patient, it cannot be guaranteed that it has not been purposely or unconsciously manipulated. Every effort has been made to obtain as much relevant data as possible for this evaluation. It is important to note that the conclusions that lead to this procedure are derived in large part from the available data. Always take into account that the treatment will also be dependent on availability of resources and existing treatment guidelines, considered by other Pain Management Practitioners as being common knowledge and practice, at this time. For Medico-Legal purposes, it is also important to point out that variations in procedural techniques and pharmacological choices are the acceptable norm. The indications, contraindications, technique, and results of the above procedure should only be interpreted and judged by a Board-Certified Interventional Pain Specialist with extensive familiarity and expertise in the same exact procedure and technique, doing otherwise would be inappropriate and unethical.  ____________________________ Kathlen Brunswick. Dossie Arbour, MD fan:ap D: 08/28/2012 16:54:14 ET T: 08/29/2012 08:15:27 ET JOB#: [id number]  cc: Osa Fogarty A. Dossie Arbour, MD, <Dictator> Gaspar Cola MD ELECTRONICALLY SIGNED 08/29/2012 10:27

## 2015-02-22 NOTE — Discharge Summary (Signed)
PATIENT NAME:  William Hawkins, William Hawkins MR#:  [id number] DATE OF BIRTH:  1976-04-24  DATE OF ADMISSION:  03/16/2014 DATE OF DISCHARGE:  03/17/2014  ADMISSION DIAGNOSIS: Cellulitis of the hand.   DISCHARGE DIAGNOSES:  1. Cellulitis of the hand secondary to a spider bite.  2. Reflex sympathetic dystrophy. 3. History of  hypothyroidism.  4. Anxiety/post traumatic stress disorder.  5. Tobacco abuse.   CONSULTATIONS:  1. Dr. Ola Spurr from infectious disease.  2. Dr. Rudene Christians from orthopedics.   LABORATORY DATA: Discharge: Sodium 140, potassium 3.8, chloride 109, bicarbonate 30, BUN 18, creatinine 1.33, glucose 150.   HOSPITAL COURSE: This is a 39 year old male with a history of reflex sympathetic dystrophy, hypothyroidism,. PTSD and chronic pain, who presented with a spider bite. For further details, please refer to H and P. 1. Spider bite with cellulitis. The patient had a reaction to the venom more than likely. He had diffuse redness/cellulitis of his hand after a spider bite. He was on vancomycin and clindamycin. ID was consulted. Dr. Ola Spurr felt this was likely related to his venom. His CT of the hand did not show evidence of arterial insufficiency or abscess. Orthopedics was consulted. Most of his symptoms are likely secondary to reflex sympathetic dystrophy. In any case, the redness has much improved with antibiotics and will need a 14 day course of antibiotics.  2. Reflex sympathetic dystrophy with chronic pain. The patient was continued on outpatient medication. 3.  Hypothyroidism. The patient was continued on Synthroid.  4. Anxiety and PTSD.  The patient is continued on his outpatient medication.  5. Nicotine dependence. The patient was counseled regarding his tobacco dependence.   DISCHARGE MEDICATIONS: 1. Fentanyl 75 mcg q. 48 hours.  2. Ambien 12.5 mg at bedtime.  3. Clonidine one dose b.i.d. at 0.2 mg at 8:00 a.m. and 0.8 mg at 8:00 p.m.  4. Klonopin 1 mg 1-1/2 tablet 4 times a day.  5.  Synthroid 125 mcg daily.  6. Morphine IR 15 mg 4 times a day.  7. Remeron 15 mg at bedtime.  8. Nicotine system kit 14 mg transdermal daily.  9. Neomycin 300 mg q.8 hours x 10 days.  10. Ciprofloxacin 500 mg q.12h. x 10 days.   DISCHARGE DIET: Regular diet.   DISCHARGE ACTIVITY: As tolerated.   DISCHARGE REFERRAL: Outpatient occupational therapy.   DISCHARGE FOLLOW-UP: The patient can follow with  Larene Beach.  TIME SPENT: 35 minutes. The patient is medically stable for discharge.   ____________________________ Sharell Hilmer P. Benjie Karvonen, MD spm:sg D: 03/17/2014 11:43:30 ET T: 03/17/2014 12:03:46 ET JOB#: [id number]  cc: Shearon Clonch P. Benjie Karvonen, MD, <Dictator> Arlis Porta., MD  Donell Beers Raynette Arras MD ELECTRONICALLY SIGNED 03/18/2014 12:29

## 2015-02-22 NOTE — H&P (Signed)
PATIENT NAME:  William Hawkins, MANGIONE MR#:  [id number] DATE OF BIRTH:  [birth year]  DATE OF ADMISSION:  03/13/2014  PRIMARY CARE PHYSICIAN: Azucena Cecil M.D.   CHIEF COMPLAINT: Right hand and arm pain and redness.   HISTORY OF PRESENT ILLNESS: This is a 39 year old male who resides in a group home, presented to the hospital today due to a significant right hand pain and redness. The patient says that he was playing with his tablet and he was done with it and he wanted to charge it so he went underneath his bed to get his cord. When he attempted to pull his cord from underneath the bed he felt like a needle had struck him in his hand and he thought he would see blood on it. When he pulled his hand out underneath the bed he noticed a small black spider crawling off his hand. He shook his hand and flung the spider away. Shortly thereafter he noticed significant pain and redness of his hand. He then started to develop some nausea and vomiting and vomited twice. He notified the nursing staff at the group home, who then sent him to the ER for further evaluation. In the Emergency Room, the patient this continues to have significant pain of his right hand and his arm is significantly cold to touch. Hospitalist services were contacted for further treatment and evaluation.   REVIEW OF SYSTEMS:  CONSTITUTIONAL: No documented fever. No weight gain or weight loss.  EYES: No blurry or double vision.  ENT: No tinnitus. No postnasal drip. No redness of the oropharynx.  RESPIRATORY: No cough, no wheeze, no hemoptysis, no dyspnea.  CARDIOVASCULAR: No chest pain. No orthopnea, no palpitations, no syncope.  GASTROINTESTINAL: Positive nausea. Positive vomiting. No diarrhea. No abdominal pain. No melena or hematochezia.  GENITOURINARY: No dysuria and hematuria.  ENDOCRINE: No polyuria or nocturia. No heat or cold intolerance.  HEMATOLOGIC: No anemia, no bruising or bleeding.  INTEGUMENTARY: No rashes. No lesions.   MUSCULOSKELETAL: No arthritis. No swelling. No gout.  NEUROLOGIC: No numbness or tingling. No ataxia. No seizure activity.  PSYCHIATRIC: No anxiety, no insomnia. No ADD. Positive PTSD.   PAST MEDICAL HISTORY: Consistent with reflex sympathetic dystrophy, hypothyroidism, PTSD, chronic pain. status post spinal cord stimulator.   ALLERGIES: PENICILLIN, AMOXICILLIN, CHEESE, TOMATO AND BEE STINGS. THE PENICILLIN AND AMOXICILLIN GIVES  HIM  SHORTNESS OF BREATH AND RASH.   FAMILY HISTORY: Mother and father are alive. Father has hypertension. Heart disease seems to run in the father's side of the family.   SOCIAL HISTORY: Does smoke, quit only a few days back, has been smoking for the past 2 to 3 years, about 1/2 pack per day. No alcohol abuse. No illicit drug abuse. Resides at his group home.   CURRENT MEDICATIONS: As follows: Ambien 12.5 mg extended release tablet at bedtime, clonidine 0.2 mg b.i.d., fentanyl patch 75 mcg q. 48 hours, Klonopin 1.5 mg 4 times a day, Synthroid 125 mcg daily, morphine immediate release 15 mg 4 times daily, Remeron 15 mg at bedtime.   PHYSICAL EXAMINATION: Presently is as follows:  VITAL SIGNS: Are noted to be temperature is 98.4, pulse 75, respirations 20, blood pressure 98/60, sats 99% on room air.  GENERAL: The patient is a pleasant-appearing male but in no apparent distress.  HEENT: Atraumatic, normocephalic. Extraocular muscles are intact. Pupils equal and reactive to light. Sclerae anicteric. No conjunctival injection. No pharyngeal erythema.  NECK: Supple. There is no jugular venous distention. No bruits, lymphadenopathy or thyromegaly.  HEART: Regular rate and rhythm. No murmurs, no rubs, no clicks.  LUNGS: Clear to auscultation bilaterally. No rales, rhonchi, no wheezes.  ABDOMEN: Soft, flat, nontender, nondistended. Has good bowel sounds. No hepatosplenomegaly appreciated.  EXTREMITIES: No evidence of any cyanosis, clubbing, or peripheral edema on the lower  extremities. His right upper extremity, his hand specifically is cold to touch and has redness  around the entire hand. He has good sensation, +2 radial and pedal pulses bilaterally. His left upper extremity is normal in appearance with no swelling or redness or any cyanosis appearing.  NEUROLOGIC: He is alert, awake, and oriented x 3 with no focal motor or sensory deficits appreciated  bilaterally.  SKIN: Moist and warm with no rashes.   LYMPHATIC: There is no cervical lymphadenopathy.   LABORATORY DATA: Showed a serum glucose of 90, BUN 21, creatinine 1.15, sodium 141, potassium 3.9, chloride 105, bicarbonate 33. White cell count 6.8, hemoglobin 16.1, hematocrit is 47.4, platelet count 227, INR is 1.1.   The patient did have an x-ray of the right hand, which showed mild soft tissue swelling otherwise negative. The patient also had a CT angiography of the right upper extremity which showed no obvious obstruction involving the arterial supply to the right upper extremity as described above.   ASSESSMENT AND PLAN: This is a 39 year old male with a history of reflex sympathetic dystrophy, hypothyroidism, post traumatic stress disorder, chronic pain, status post spinal cord stimulator, came to the hospital due to a spider bite and having a significant localized reaction.  1. Spider bite/cellulitis/allergic reaction. The patient had a spider bite around 10:00 a.m. today and presented with a severe allergic localized reaction. There is some concern of a possible underlying cellulitis, but my suspicion is low as his arm is more cold in nature rather than warm. He has no fever. He has no white cell count. Although, I will go ahead and empirically treat him with vancomycin. I will observe him overnight to make sure that he has no evidence of anaphylaxis or any evidence of further allergic reaction to the spider bite. I will apply warm compress to the right hand and continue supportive care with antiemetics and  pain control. His CT angiography of the right hand did not show any evidence of arterial insufficiency.  2. Reflex sympathetic dystrophy with chronic pain. I will continue his fentanyl patch and morphine immediate release for pain control.  3. Hypothyroidism. Continue Synthroid.  4. Anxiety/ post traumatic stress disorder. Continue with his Klonopin and Remeron.  5. Tobacco abuse. Continue with a nicotine patch.   CODE STATUS: The patient is a full code.   TIME SPENT: 50 minutes.    ____________________________ Rolly PancakeVivek J. Cherlynn KaiserSainani, MD vjs:sg D: 03/14/2014 10:58:11 ET T: 03/14/2014 12:00:42 ET JOB#: [id number]  cc: Rolly PancakeVivek J. Cherlynn KaiserSainani, MD, <Dictator> Houston SirenVIVEK J Danya Spearman MD ELECTRONICALLY SIGNED 03/27/2014 15:06

## 2015-02-22 NOTE — Consult Note (Signed)
Brief Consult Note: Diagnosis: right hand spider bite allergic reaction, h/o RSD on left UE.   Patient was seen by consultant.   Orders entered.   Comments: no evidence of abscess He is very tentative with touching and moivng hand, concern for RSD OT ordered along with Neurontin.  Electronic Signatures: Leitha SchullerMenz, Poppy Mcafee J (MD)  (Signed 16-May-15 12:04)  Authored: Brief Consult Note   Last Updated: 16-May-15 12:04 by Leitha SchullerMenz, Jaryan Chicoine J (MD)

## 2015-02-22 NOTE — Consult Note (Signed)
PATIENT NAME:  William Hawkins, William Hawkins MR#:  [id number] DATE OF BIRTH:  1975/11/04  DATE OF CONSULTATION:  03/16/2014  REFERRING PHYSICIAN:   CONSULTING PHYSICIAN:  Leitha SchullerMichael J. Jakari Sada, MD  REASON FOR CONSULTATION: Right hand pain.   HISTORY OF PRESENT ILLNESS: The patient had an apparent spider bite which gave him a very unusual allergic reaction, initially apparently he had swelling. He has been having pain that radiates up to his elbow. He has a history of prior reflex dystrophy to the left arm and is worried about this happening in his right arm.   ON EXAM: He holds the hand in an unusual position with the MCP joints extended, the DIP and PIP joints slightly flexed. There is erythema around the hand predominantly on the dorsum of the hand. There is no significant swelling to the hand, no fluctuance. He has pain with passive range of motion of the fingers but it is not well localized. He has pain to light touch is well.   CLINICAL IMPRESSION: Allergic reaction to insect bite. With his history of prior reflex sympathetic dystrophy, I think we should focus on preventing reflex sympathetic dystrophy in this upper extremity. Neurontin 300 mg t.i.Hawkins. has been ordered. Additionally, he will need some outpatient occupational therapy and, if his pain persists, consider pain clinic evaluation with possible stellate ganglion block. He will need followup as an outpatient. No evidence of acute infectious process in the arm at this time.   ____________________________ Leitha SchullerMichael J. Curtisha Bendix, MD mjm:cs Hawkins: 03/17/2014 19:49:43 ET T: 03/17/2014 20:23:03 ET JOB#: [id number]  cc: Leitha SchullerMichael J. Karizma Cheek, MD, <Dictator> Leitha SchullerMICHAEL J Keevon Henney MD ELECTRONICALLY SIGNED 03/18/2014 8:00

## 2015-02-22 NOTE — Consult Note (Signed)
PATIENT NAME:  William Hawkins, William Hawkins MR#:  [id number] DATE OF BIRTH:  1976-05-03  DATE OF CONSULTATION:  03/15/2014  REFERRING PHYSICIAN:  Dr. Bridgette Habermann.  CONSULTING PHYSICIAN:  Cheral Marker. Ola Spurr, MD  REASON FOR CONSULTATION:  Cellulitis and spider bite.   HISTORY OF PRESENT ILLNESS:  This is a very pleasant 39 year old gentleman with a history of reflex sympathetic dystrophy of his left upper extremity who lives in a group home. He was admitted 03/13/2014 after having a spider bite to his right dorsum of his hand on 03/13/2014 around 10:00 p.m. He says he was sitting in his bed when he reached under his bed to get a charger. He felt a sharp pain like a nail struck his hand. When he pulled it out, there was a black spider, approximately 1 to 1.5 cm, on it. He quickly flung the spider away. He then developed worsening redness and pain in the hand. He also developed nausea, vomiting. He was having muscle pain and spasm in his abdomen and was bent over with this. He came to the Emergency Room where it was felt he likely had encountered a black widow's bite. He was admitted for further management of the pain and possible infection.   PAST MEDICAL HISTORY:  1.  Reflex sympathetic dystrophy.  2.  PTSD.  3.  Hypothyroidism.  4.  Chronic pain.  5.  Status post spinal cord stimulator.   SOCIAL HISTORY:  The patient lives in a group home. He smokes intermittently. He does not drink or use drugs.   FAMILY HISTORY:  Positive for father with hypertension.   ALLERGIES:  PENICILLIN AND AMOXICILLIN WHICH CAUSE SHORTNESS OF BREATH AND RASH. HE IS ALSO ALLERGIC TO BEE STINGS.  REVIEW OF SYSTEMS:  A total of 11 systems reviewed and negative except as per HPI.   MEDICATIONS:  As an outpatient, he was on Ambien, clonidine, fentanyl patch, Klonopin, Synthroid, morphine, Remeron. Since admission, he has been on vancomycin.   PHYSICAL EXAMINATION: VITAL SIGNS:  The patient was afebrile on admission at 98.4. Current  temperature is 98, pulse 59, blood pressure 96/59, respirations 18, sat 96% on room air.  GENERAL:  He is thin. He is in no acute distress.  HEENT:  His pupils are equal, round, reactive to light and accommodation. Extra ocular movements are intact. Sclerae are anicteric. His oropharynx mucous membranes are somewhat dry. No thrush.   NECK:  Supple. No anterior cervical, posterior cervical or supraclavicular lymphadenopathy.  HEART:  Regular.  LUNGS:  Clear.  ABDOMEN:  Soft, nontender, nondistended. No hepatosplenomegaly.  EXTREMITIES:  On his right upper extremity over the dorsum of his hand, he has a patchy, blanching erythema. There is an area in the dorsum of his hand where there is a pinpoint lesion which he says is where the spider was. He has some pain with extension of his fingers although there is no swelling or marked tenderness over the palmar surface. The erythema extends to his wrist. There is no warmth or induration.   LABORATORY, DIAGNOSTIC AND RADIOLOGICAL DATA:  White blood count on admission was normal at 6.8, hemoglobin 16.1, platelets 227. Basic panel normal. INR normal.  He had imaging with a CT of his right upper extremity that showed normal arterial supply. There is blurring of the fat planes within the musculature of the right thenar eminence and first web space. There is some developing edema within the musculature is not excluded. Spinal cord stimulator in the C-spine.   IMPRESSION:  A  39 year old lives in a group home has reflex sympathetic dystrophy, who was admitted following a witnessed bite by a black spider on the dorsum of his hand. He then had nausea, vomiting, abdominal pain that felt he could bend over. He now has erythema and blanching on his right hand and pain that limited ability to open and close his palm.   I do suspect he has a black widow's bite. He has the symptoms abdominal cramping which goes along with it as well as the typical finding on his hand. I do  not believe he has an extensive infection there although there is some concern noted on the CT for some edema in the thenar eminence. He has a normal white count, a normal ESR and has not had a fever. He has no elevated white blood count.   RECOMMENDATIONS:  1.  Repeat CBC to see if his white count is elevated.  2.  Continue elevation of the hand and symptomatic management for pain.  3.  Would continue vancomycin for another day and add ciprofloxacin. He is PENICILLIN ALLERGIC.  4.  Again, I think this is most likely from the envenomation reaction to the bite as opposed to an overwhelming infection. He could likely be transitioned to oral antibiotics with clindamycin and ciprofloxacin or doxycycline and ciprofloxacin in the next 1 to 2 days and discharged home.   Thank you for the consult. I will be glad to follow with you.   ____________________________ Cheral Marker. Ola Spurr, MD dpf:jm D: 03/15/2014 15:24:11 ET T: 03/15/2014 16:05:42 ET JOB#: [id number]  cc: Cheral Marker. Ola Spurr, MD, <Dictator> DAVID Ola Spurr MD ELECTRONICALLY SIGNED 03/25/2014 20:58
